# Patient Record
Sex: Female | Born: 1975 | Race: Black or African American | Hispanic: No | Marital: Married | Smoking: Never smoker
Health system: Southern US, Community
[De-identification: ages and names within clinical notes are randomized; demographics above are authoritative.]

---

## 2005-02-14 ENCOUNTER — Emergency Department: Payer: Self-pay | Admitting: Emergency Medicine

## 2017-10-08 ENCOUNTER — Other Ambulatory Visit: Payer: Self-pay | Admitting: Student

## 2017-10-08 DIAGNOSIS — Z1231 Encounter for screening mammogram for malignant neoplasm of breast: Secondary | ICD-10-CM

## 2017-10-26 ENCOUNTER — Ambulatory Visit
Admission: RE | Admit: 2017-10-26 | Discharge: 2017-10-26 | Disposition: A | Discharge status: Home / Self Care | Payer: BLUE CROSS/BLUE SHIELD | Source: Ambulatory Visit | Attending: Student | Admitting: Student

## 2017-10-26 ENCOUNTER — Encounter: Payer: Self-pay | Admitting: Radiology

## 2017-10-26 DIAGNOSIS — Z1231 Encounter for screening mammogram for malignant neoplasm of breast: Secondary | ICD-10-CM | POA: Insufficient documentation

## 2017-10-28 ENCOUNTER — Inpatient Hospital Stay
Admission: RE | Admit: 2017-10-28 | Discharge: 2017-10-28 | Disposition: A | Discharge status: Home / Self Care | Payer: Self-pay | Source: Ambulatory Visit | Attending: *Deleted | Admitting: *Deleted

## 2017-10-28 ENCOUNTER — Other Ambulatory Visit: Payer: Self-pay | Admitting: *Deleted

## 2017-10-28 DIAGNOSIS — Z9289 Personal history of other medical treatment: Secondary | ICD-10-CM

## 2018-09-28 ENCOUNTER — Other Ambulatory Visit: Payer: Self-pay | Admitting: Student

## 2018-09-28 DIAGNOSIS — Z1231 Encounter for screening mammogram for malignant neoplasm of breast: Secondary | ICD-10-CM

## 2018-11-10 ENCOUNTER — Other Ambulatory Visit: Payer: Self-pay

## 2018-11-10 ENCOUNTER — Ambulatory Visit
Admission: RE | Admit: 2018-11-10 | Discharge: 2018-11-10 | Disposition: A | Discharge status: Home / Self Care | Payer: BC Managed Care – PPO | Source: Ambulatory Visit | Attending: Student | Admitting: Student

## 2018-11-10 DIAGNOSIS — Z1231 Encounter for screening mammogram for malignant neoplasm of breast: Secondary | ICD-10-CM

## 2019-07-03 ENCOUNTER — Ambulatory Visit: Payer: BC Managed Care – PPO | Attending: Internal Medicine

## 2019-07-03 DIAGNOSIS — Z23 Encounter for immunization: Secondary | ICD-10-CM | POA: Insufficient documentation

## 2019-07-03 NOTE — Progress Notes (Signed)
   Covid-19 Vaccination Clinic  Name:  WAUNETA HANFORD    MRN: [id number] DOB: [birth year]  07/03/2019  Ms. Grable was observed post Covid-19 immunization for 15 minutes without incident. She was provided with Vaccine Information Sheet and instruction to access the V-Safe system.   Ms. Currey was instructed to call 911 with any severe reactions post vaccine: Marland Kitchen Difficulty breathing  . Swelling of face and throat  . A fast heartbeat  . A bad rash all over body  . Dizziness and weakness   Immunizations Administered    Name Date Dose VIS Date Route   Pfizer COVID-19 Vaccine 07/03/2019  8:55 AM 0.3 mL 04/08/2019 Intramuscular   Manufacturer: [place]   Lot: [id number]   [place]: [id number]

## 2019-07-25 ENCOUNTER — Ambulatory Visit: Payer: BC Managed Care – PPO | Attending: Internal Medicine

## 2019-07-25 DIAGNOSIS — Z23 Encounter for immunization: Secondary | ICD-10-CM

## 2019-07-25 NOTE — Progress Notes (Signed)
   Covid-19 Vaccination Clinic  Name:  MAILYN HORITA    MRN: [id number] DOB: 10-24-1975  07/25/2019  Ms. Jesko was observed post Covid-19 immunization for 15 minutes without incident. She was provided with Vaccine Information Sheet and instruction to access the V-Safe system.   Ms. Sadowsky was instructed to call 911 with any severe reactions post vaccine: Marland Kitchen Difficulty breathing  . Swelling of face and throat  . A fast heartbeat  . A bad rash all over body  . Dizziness and weakness   Immunizations Administered    Name Date Dose VIS Date Route   Pfizer COVID-19 Vaccine 07/25/2019  8:19 AM 0.3 mL 04/08/2019 Intramuscular   Manufacturer: [place]   Lot: [id number]   [place]: [id number]

## 2019-10-12 DIAGNOSIS — M25551 Pain in right hip: Secondary | ICD-10-CM | POA: Insufficient documentation

## 2019-11-02 ENCOUNTER — Other Ambulatory Visit: Payer: Self-pay | Admitting: Student

## 2019-11-02 DIAGNOSIS — Z1231 Encounter for screening mammogram for malignant neoplasm of breast: Secondary | ICD-10-CM

## 2019-11-15 ENCOUNTER — Ambulatory Visit
Admission: RE | Admit: 2019-11-15 | Discharge: 2019-11-15 | Disposition: A | Discharge status: Home / Self Care | Payer: BC Managed Care – PPO | Source: Ambulatory Visit | Attending: Student | Admitting: Student

## 2019-11-15 DIAGNOSIS — Z1231 Encounter for screening mammogram for malignant neoplasm of breast: Secondary | ICD-10-CM | POA: Insufficient documentation

## 2019-11-22 ENCOUNTER — Other Ambulatory Visit: Payer: Self-pay | Admitting: Student

## 2019-11-22 DIAGNOSIS — N6310 Unspecified lump in the right breast, unspecified quadrant: Secondary | ICD-10-CM

## 2019-11-22 DIAGNOSIS — R928 Other abnormal and inconclusive findings on diagnostic imaging of breast: Secondary | ICD-10-CM

## 2019-11-22 DIAGNOSIS — N6489 Other specified disorders of breast: Secondary | ICD-10-CM

## 2019-11-30 ENCOUNTER — Ambulatory Visit
Admission: RE | Admit: 2019-11-30 | Discharge: 2019-11-30 | Disposition: A | Discharge status: Home / Self Care | Payer: BC Managed Care – PPO | Source: Ambulatory Visit | Attending: Student | Admitting: Student

## 2019-11-30 ENCOUNTER — Other Ambulatory Visit: Payer: Self-pay

## 2019-11-30 DIAGNOSIS — R928 Other abnormal and inconclusive findings on diagnostic imaging of breast: Secondary | ICD-10-CM

## 2019-11-30 DIAGNOSIS — N6489 Other specified disorders of breast: Secondary | ICD-10-CM | POA: Insufficient documentation

## 2019-11-30 DIAGNOSIS — N6310 Unspecified lump in the right breast, unspecified quadrant: Secondary | ICD-10-CM

## 2019-12-05 ENCOUNTER — Other Ambulatory Visit: Payer: Self-pay | Admitting: Student

## 2019-12-05 DIAGNOSIS — R928 Other abnormal and inconclusive findings on diagnostic imaging of breast: Secondary | ICD-10-CM

## 2019-12-05 DIAGNOSIS — N6310 Unspecified lump in the right breast, unspecified quadrant: Secondary | ICD-10-CM

## 2019-12-07 ENCOUNTER — Ambulatory Visit
Admission: RE | Admit: 2019-12-07 | Discharge: 2019-12-07 | Disposition: A | Discharge status: Home / Self Care | Payer: BC Managed Care – PPO | Source: Ambulatory Visit | Attending: Student | Admitting: Student

## 2019-12-07 ENCOUNTER — Other Ambulatory Visit: Payer: Self-pay

## 2019-12-07 DIAGNOSIS — N6310 Unspecified lump in the right breast, unspecified quadrant: Secondary | ICD-10-CM | POA: Insufficient documentation

## 2019-12-07 DIAGNOSIS — R928 Other abnormal and inconclusive findings on diagnostic imaging of breast: Secondary | ICD-10-CM | POA: Diagnosis present

## 2019-12-07 HISTORY — PX: BREAST BIOPSY: SHX20

## 2019-12-08 LAB — SURGICAL PATHOLOGY

## 2020-02-03 ENCOUNTER — Encounter: Payer: Self-pay | Admitting: Podiatry

## 2020-02-03 ENCOUNTER — Other Ambulatory Visit: Payer: Self-pay

## 2020-02-03 ENCOUNTER — Ambulatory Visit (INDEPENDENT_AMBULATORY_CARE_PROVIDER_SITE_OTHER): Payer: BC Managed Care – PPO

## 2020-02-03 ENCOUNTER — Ambulatory Visit: Payer: BC Managed Care – PPO | Admitting: Podiatry

## 2020-02-03 DIAGNOSIS — M79674 Pain in right toe(s): Secondary | ICD-10-CM | POA: Diagnosis not present

## 2020-02-03 DIAGNOSIS — B351 Tinea unguium: Secondary | ICD-10-CM | POA: Diagnosis not present

## 2020-02-03 DIAGNOSIS — M79675 Pain in left toe(s): Secondary | ICD-10-CM

## 2020-02-03 DIAGNOSIS — M2011 Hallux valgus (acquired), right foot: Secondary | ICD-10-CM

## 2020-02-03 MED ORDER — TERBINAFINE HCL 250 MG PO TABS: 250.0000 mg | ORAL_TABLET | Freq: Every day | ORAL | 0 refills | Status: AC

## 2020-02-03 NOTE — Progress Notes (Signed)
° °  Subjective: 44 y.o. female presents today as a new patient for evaluation of a symptomatic bunion to the right foot.  Patient denies history of injury or trauma to the area.  She has not done anything for treatment other than shoe gear modifications.  She states that her bunion gets red and swollen on occasion with certain shoes.  This has been going on for several weeks now.   Patient also states that she has discoloration with thickening to her bilateral toenails.  She would like to have them evaluated.  This is been present for several years.  She denies trauma to the nail plate.   No past medical history on file.    Objective: Physical Exam General: The patient is alert and oriented x3 in no acute distress.  Dermatology: Skin is cool, dry and supple bilateral lower extremities. Negative for open lesions or macerations.  Hyperkeratotic dystrophic discolored nails noted 1-5 bilateral.  Vascular: Palpable pedal pulses bilaterally. No edema or erythema noted. Capillary refill within normal limits.  Neurological: Epicritic and protective threshold grossly intact bilaterally.   Musculoskeletal Exam: Clinical evidence of bunion deformity noted to the respective foot. There is moderate pain on palpation range of motion of the first MPJ. Lateral deviation of the hallux noted consistent with hallux abductovalgus.  Radiographic Exam: Increased intermetatarsal angle greater than 15 with a hallux abductus angle greater than 30 noted on AP view. Moderate degenerative changes noted within the first MPJ.  Assessment: 1. HAV w/ bunion deformity right   Plan of Care:  1. Patient was evaluated. X-Rays reviewed. 2.  Discussed conservative versus surgical management of bunion deformities.  At the moment the recommend conservative treatment.  Recommend good supportive shoes that are wide fitting and do not irritate the foot. 3.  We also discussed different treatment modalities for toenail fungus.   Prescription for Lamisil 250 mg #90 daily.  Patient denies any liver symptoms or pathology. 4.  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 Edrick Kins, [place]                                        [place], [place] [address]                Office [phone]  Fax [phone]

## 2021-01-30 ENCOUNTER — Other Ambulatory Visit: Payer: Self-pay | Admitting: Student

## 2021-01-30 DIAGNOSIS — D241 Benign neoplasm of right breast: Secondary | ICD-10-CM

## 2021-01-30 DIAGNOSIS — R928 Other abnormal and inconclusive findings on diagnostic imaging of breast: Secondary | ICD-10-CM

## 2021-07-05 ENCOUNTER — Ambulatory Visit: Payer: BC Managed Care – PPO | Admitting: Podiatry

## 2021-07-05 ENCOUNTER — Ambulatory Visit (INDEPENDENT_AMBULATORY_CARE_PROVIDER_SITE_OTHER): Payer: BC Managed Care – PPO

## 2021-07-05 ENCOUNTER — Other Ambulatory Visit: Payer: Self-pay

## 2021-07-05 ENCOUNTER — Encounter: Payer: Self-pay | Admitting: Podiatry

## 2021-07-05 DIAGNOSIS — M2011 Hallux valgus (acquired), right foot: Secondary | ICD-10-CM

## 2021-07-05 NOTE — Progress Notes (Signed)
? ?  Subjective: 46 y.o. female presents today for follow-up evaluation of a symptomatic bunion to the right foot.  Patient states that she is ready to have surgery performed to correct for the bunion deformity.  She was last seen in the office on 02/03/2020 and she says over the last few years it is only gotten worse and more symptomatic.  She presents for further treatment and evaluation ? ?No past medical history on file. ? ?Past Surgical History:  ?Procedure Laterality Date  ? BREAST BIOPSY Right 12/07/2019  ? Q shape, path pending, [place] Bx   ? ?No Known Allergies ? ? ?Objective: ?Physical Exam ?General: The patient is alert and oriented x3 in no acute distress. ? ?Dermatology: Skin is cool, dry and supple bilateral lower extremities. Negative for open lesions or macerations.  Hyperkeratotic dystrophic discolored nails noted 1-5 bilateral. ? ?Vascular: Palpable pedal pulses bilaterally. No edema or erythema noted. Capillary refill within normal limits. ? ?Neurological: Epicritic and protective threshold grossly intact bilaterally.  ? ?Musculoskeletal Exam: Clinical evidence of bunion deformity noted to the respective foot. There is moderate pain on palpation range of motion of the first MPJ. Lateral deviation of the hallux noted consistent with hallux abductovalgus. ? ?Radiographic Exam: Increased intermetatarsal angle greater than 15? with a hallux abductus angle greater than 30? noted on AP view. Moderate degenerative changes noted within the first MPJ. ? ?Assessment: ?1. HAV w/ bunion deformity right ? ? ?Plan of Care:  ?1. Patient was evaluated. X-Rays reviewed. ?2. Today we discussed the conservative versus surgical management of the presenting pathology. The patient opts for surgical management. All possible complications and details of the procedure were explained. All patient questions were answered. No guarantees were expressed or implied. ?3. Authorization for surgery was initiated today. Surgery will  consist of bunionectomy with osteotomy right ?4.  Return to clinic 1 week postop ? ? 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 ? ?Edrick Kins, DPM ?[place] ? ?Dr. Edrick Kins, DPM  ?  ?[place]                                        ?[place], [place] [address]                ?Office [phone]  ?Fax [phone] ? ? ? ? ? ? ?

## 2021-07-09 ENCOUNTER — Telehealth: Payer: Self-pay

## 2021-07-09 NOTE — Telephone Encounter (Signed)
Received surgery paperwork from the [place] office. Left a message for Abigail to call and schedule surgery with Dr. Amalia Hailey. ?

## 2021-07-10 ENCOUNTER — Telehealth: Payer: Self-pay | Admitting: Urology

## 2021-07-10 NOTE — Telephone Encounter (Signed)
DOS - 08/01/21 ? ?[place] BUNIONECTOM[place] RIGHT --- [phone] ? ?BCBS EFFECTIVE DATE - 04/28/20 ? ?PLAN DEDUCTIBLE - $500.00 ?OUT OF POCKET - $3,000.00 ?COINSURANCE - 0% ?COPAY - $0.00 ? ? ?SPOKE WITH DANN M. WITH [employer] AND SHE STATED THAT FOR CPT CODE 48889 NO PRIOR AUTH IS REQUIRED. ? ?REF # [id number] ?

## 2021-07-12 DIAGNOSIS — M79676 Pain in unspecified toe(s): Secondary | ICD-10-CM

## 2021-08-01 ENCOUNTER — Other Ambulatory Visit: Payer: Self-pay | Admitting: Podiatry

## 2021-08-01 ENCOUNTER — Encounter: Payer: Self-pay | Admitting: Podiatry

## 2021-08-01 DIAGNOSIS — M2011 Hallux valgus (acquired), right foot: Secondary | ICD-10-CM | POA: Diagnosis not present

## 2021-08-01 MED ORDER — MELOXICAM 15 MG PO TABS: 15.0000 mg | ORAL_TABLET | Freq: Every day | ORAL | 1 refills | Status: AC

## 2021-08-01 MED ORDER — OXYCODONE-ACETAMINOPHEN 5-325 MG PO TABS: 1.0000 | ORAL_TABLET | ORAL | 0 refills | Status: AC | PRN

## 2021-08-01 NOTE — Progress Notes (Signed)
PRN postop 

## 2021-08-08 ENCOUNTER — Ambulatory Visit (INDEPENDENT_AMBULATORY_CARE_PROVIDER_SITE_OTHER): Payer: BC Managed Care – PPO

## 2021-08-08 ENCOUNTER — Ambulatory Visit (INDEPENDENT_AMBULATORY_CARE_PROVIDER_SITE_OTHER): Payer: BC Managed Care – PPO | Admitting: Podiatry

## 2021-08-08 ENCOUNTER — Encounter: Payer: Self-pay | Admitting: Podiatry

## 2021-08-08 VITALS — BP 125/97 | HR 80 | Temp 98.0°F

## 2021-08-08 DIAGNOSIS — Z9889 Other specified postprocedural states: Secondary | ICD-10-CM | POA: Diagnosis not present

## 2021-08-08 DIAGNOSIS — M2011 Hallux valgus (acquired), right foot: Secondary | ICD-10-CM | POA: Diagnosis not present

## 2021-08-08 NOTE — Progress Notes (Signed)
?  Subjective:  ?Patient ID: Kathy Mitchell, female    DOB: 04/28/1976,  MRN: [id number] ? ?Chief Complaint  ?Patient presents with  ? Routine Post Op  ?  POV #1 DOS 08/01/2021 BUNIONECTOMY W/OSTEOTOMY RT FOOT/DR EVANS PT  ? ? ?DOS: 08/01/2021 ?Procedure: Right foot bunionectomy ? ?46 y.o. female returns for post-op check.  Patient states she is doing well.  The surgery was done by Dr. Amalia Hailey.  She states occasional pain.  Pain is controlled by pain medication.  Denies any other acute issues ? ?Review of Systems: Negative except as noted in the HPI. Denies N/V/F/Ch. ? ?No past medical history on file. ? ?Current Outpatient Medications:  ?  cetirizine (ZYRTEC) 10 MG tablet, Take by mouth., Disp: , Rfl:  ?  fluticasone (FLONASE) 50 MCG/ACT nasal spray, Place into the nose., Disp: , Rfl:  ?  meloxicam (MOBIC) 15 MG tablet, Take 1 tablet (15 mg total) by mouth daily., Disp: 30 tablet, Rfl: 1 ?  oxyCODONE-acetaminophen (PERCOCET) 5-325 MG tablet, Take 1 tablet by mouth every 4 (four) hours as needed for severe pain., Disp: 30 tablet, Rfl: 0 ?  terbinafine (LAMISIL) 250 MG tablet, Take 1 tablet (250 mg total) by mouth daily., Disp: 90 tablet, Rfl: 0 ? ?Social History  ? ?Tobacco Use  ?Smoking Status Never  ?Smokeless Tobacco Never  ? ? ?No Known Allergies ?Objective:  ? ?Vitals:  ? 08/08/21 1327  ?BP: (!) 125/97  ?Pulse: 80  ?Temp: 98 ?F (36.7 ?C)  ? ?There is no height or weight on file to calculate BMI. ?Constitutional Well developed. ?Well nourished.  ?Vascular Foot warm and well perfused. ?Capillary refill normal to all digits.   ?Neurologic Normal speech. ?Oriented to person, place, and time. ?Epicritic sensation to light touch grossly present bilaterally.  ?Dermatologic Skin healing well without signs of infection. Skin edges well coapted without signs of infection.  ?Orthopedic: Tenderness to palpation noted about the surgical site.  ? ?Radiographs: 3 views of skeletally mature the right foot: Good correction alignment  noted reduction of sesamoid noted reduction of bunion noted.  No hardware failure noted ?Assessment:  ? ?1. Hallux abducto valgus, right   ?2. S/P foot surgery, right   ? ?Plan:  ?Patient was evaluated and treated and all questions answered. ? ?S/p foot surgery right ?-Progressing as expected post-operatively. ?-XR: See above ?-WB Status: Partial weightbearing to the heel with a cam boot ?-Sutures: Intact.  No clinical signs of Deis is noted.  No complication noted. ?-Medications: None ?-Foot redressed. ? ?No follow-ups on file.  ?

## 2021-08-16 ENCOUNTER — Encounter: Payer: Self-pay | Admitting: Podiatry

## 2021-08-16 ENCOUNTER — Ambulatory Visit (INDEPENDENT_AMBULATORY_CARE_PROVIDER_SITE_OTHER): Payer: BC Managed Care – PPO | Admitting: Podiatry

## 2021-08-16 DIAGNOSIS — Z9889 Other specified postprocedural states: Secondary | ICD-10-CM

## 2021-08-16 NOTE — Progress Notes (Signed)
? ?  Subjective:  ?Patient presents today status post bunionectomy with first metatarsal osteotomy right foot. DOS: 08/01/2021.  Patient states that she is doing well.  Minimal pain.  She has been minimally weightbearing in the cam boot.  No new complaints at this time ? ?No past medical history on file. ?  ? ?Objective/Physical Exam ?Neurovascular status intact.  Skin incisions appear to be well coapted with sutures intact. No sign of infectious process noted. No dehiscence. No active bleeding noted. Moderate edema noted to the surgical extremity. ? ?Assessment: ?1. s/p bunionectomy with first metatarsal osteotomy RT. DOS: 08/01/2021 ? ? ?Plan of Care:  ?1. Patient was evaluated. X-rays reviewed that were taken last visit ?2.  Sutures removed ?3.  Compression ankle sleeve dispensed.  Wear daily ?4.  Continue minimal weightbearing in the cam boot ?5.  Return to clinic in 2 weeks for follow-up x-ray ? 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 ?Edrick Kins, DPM ?[place] ? ?Dr. Edrick Kins, DPM  ?  ?[address]                                    ?[place], [place] [address]                ?Office [phone]  ?Fax [phone] ? ? ? ? ? ?

## 2021-08-30 ENCOUNTER — Ambulatory Visit (INDEPENDENT_AMBULATORY_CARE_PROVIDER_SITE_OTHER): Payer: BC Managed Care – PPO

## 2021-08-30 ENCOUNTER — Encounter: Payer: Self-pay | Admitting: Podiatry

## 2021-08-30 ENCOUNTER — Ambulatory Visit (INDEPENDENT_AMBULATORY_CARE_PROVIDER_SITE_OTHER): Payer: BC Managed Care – PPO | Admitting: Podiatry

## 2021-08-30 ENCOUNTER — Telehealth: Payer: Self-pay | Admitting: Podiatry

## 2021-08-30 DIAGNOSIS — Z9889 Other specified postprocedural states: Secondary | ICD-10-CM

## 2021-08-30 NOTE — Telephone Encounter (Signed)
Pt is requesting a Dr note for needing to be out of work. Pt works at [employer] as the [employer]. ?Fax# [phone] ?

## 2021-08-30 NOTE — Progress Notes (Signed)
? ?  Subjective:  ?Patient presents today status post bunionectomy with first metatarsal osteotomy right foot. DOS: 08/01/2021.  Patient doing well.  She has been weightbearing in the cam boot as instructed.  No new complaints at this time ? ?No past medical history on file. ?  ? ? ?Objective/Physical Exam ?Neurovascular status intact.  Skin incisions healed.  There continues to be some moderate edema noted.  Good alignment of the first ray. ? ?Radiographic exam RT foot 08/30/2021 ?Good alignment of the first ray.  Osteotomy site and orthopedic hardware appears stable with routine healing.  There is some slight medial deviation of the hallux at the MTP ? ?Assessment: ?1. s/p bunionectomy with first metatarsal osteotomy RT. DOS: 08/01/2021 ? ? ?Plan of Care:  ?1. Patient was evaluated.  X-rays reviewed  ?2.  Patient may now slowly begin to transition out of the cam boot into good supportive shoes and sneakers ?3.  Continue to refrain from work for an additional month ?4.  Continue compression ankle sleeve ?5.  Recommend daily stretching and range of motion exercises.   ?6.  Return to clinic 4 weeks for follow-up x-ray ? 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 ?Edrick Kins, DPM ?[place] ? ?Dr. Edrick Kins, DPM  ?  ?[address]                                    ?[place],  [address]                ?Office [phone]  ?Fax [phone] ? ? ? ? ? ?

## 2021-08-30 NOTE — Telephone Encounter (Signed)
Okay to provide note to be out of work.  Patient can come into the office and pick up the work note.  Refrain from work additional 4 weeks until next follow-up appointment.  Thanks, Dr. Amalia Hailey

## 2021-09-02 ENCOUNTER — Encounter: Payer: Self-pay | Admitting: Podiatry

## 2021-09-02 NOTE — Telephone Encounter (Signed)
I am returning your call.  Dr. Amalia Hailey said it's okay for you to get a note to be out of work for an additional 4 weeks until your next follow-up appointment.  "I already got the note.  I got it this morning.  Thanks for calling me back." ?

## 2021-10-04 ENCOUNTER — Encounter: Payer: Self-pay | Admitting: Podiatry

## 2021-10-04 ENCOUNTER — Ambulatory Visit (INDEPENDENT_AMBULATORY_CARE_PROVIDER_SITE_OTHER): Payer: BC Managed Care – PPO

## 2021-10-04 ENCOUNTER — Ambulatory Visit: Payer: BC Managed Care – PPO | Admitting: Podiatry

## 2021-10-04 DIAGNOSIS — Z9889 Other specified postprocedural states: Secondary | ICD-10-CM

## 2021-10-04 NOTE — Progress Notes (Signed)
   Subjective:  Patient presents today status post bunionectomy with first metatarsal osteotomy right foot. DOS: 08/01/2021.  Patient doing well.  Patient has been wearing tennis shoes for the most part.  Occasionally she will wear the cam boot if she is going to be on her feet for prolonged period of time.  Overall she believes is doing very well.  No pain.  She presents for further treatment and evaluation  No past medical history on file.    Objective/Physical Exam Neurovascular status intact.  Skin incisions healed.  Edema resolved.  Good alignment of the first ray.  There is some limited range of motion with plantarflexion of the toe.  Radiographic exam RT foot 10/04/2021 Good alignment of the first ray.  Osteotomy site and orthopedic hardware appears stable with routine healing.  There is some slight medial deviation of the hallux at the MTP.  Unchanged since prior x-rays  Assessment: 1. s/p bunionectomy with first metatarsal osteotomy RT. DOS: 08/01/2021   Plan of Care:  1. Patient was evaluated.  X-rays reviewed  2.  Patient may completely discontinue the cam boot.  Overall she is doing very well. 3.  Resume good supportive tennis shoes and sneakers daily 4.  Patient may also begin to increase her exercise and activity back to full activity no restrictions 5.  Recommend daily range of motion exercises to the great toe joint  6.  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 Edrick Kins, DPM    [address], [place] [address]                Office [phone]  Fax [phone]

## 2021-10-17 ENCOUNTER — Other Ambulatory Visit: Payer: Self-pay | Admitting: Student

## 2021-10-17 DIAGNOSIS — R928 Other abnormal and inconclusive findings on diagnostic imaging of breast: Secondary | ICD-10-CM

## 2021-10-17 DIAGNOSIS — D241 Benign neoplasm of right breast: Secondary | ICD-10-CM

## 2021-11-11 ENCOUNTER — Ambulatory Visit
Admission: RE | Admit: 2021-11-11 | Discharge: 2021-11-11 | Disposition: A | Discharge status: Home / Self Care | Payer: BC Managed Care – PPO | Source: Ambulatory Visit | Attending: Student | Admitting: Student

## 2021-11-11 DIAGNOSIS — R928 Other abnormal and inconclusive findings on diagnostic imaging of breast: Secondary | ICD-10-CM | POA: Diagnosis present

## 2021-11-11 DIAGNOSIS — D241 Benign neoplasm of right breast: Secondary | ICD-10-CM | POA: Diagnosis present

## 2022-06-24 IMAGING — MG US  BREAST BX W/ LOC DEV 1ST LESION IMG BX SPEC US GUIDE*R*
1 series · 8 of 8 positions shown · non-contrast
Comparison: Previous exam(s).
COMPARISON: Previous exam(s).

Addendum:
CLINICAL DATA: 44-year-old female presenting for ultrasound-guided
biopsy of a right breast mass.

EXAM:
ULTRASOUND GUIDED RIGHT BREAST CORE NEEDLE BIOPSY

[Series 1: MG view · 0.07mm/px · 8 of 10 slices shown]
[im 1/10]
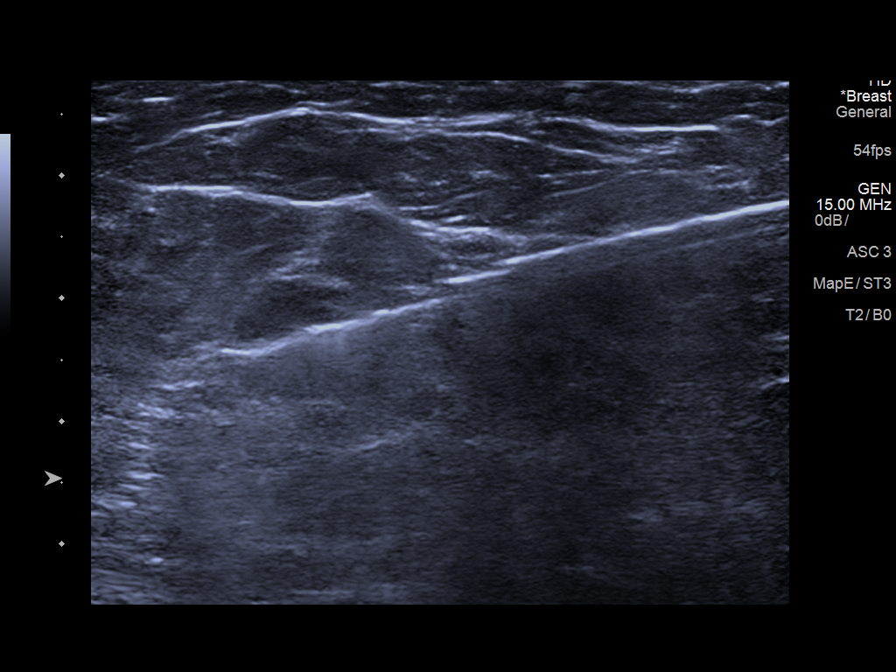
[im 2/10]
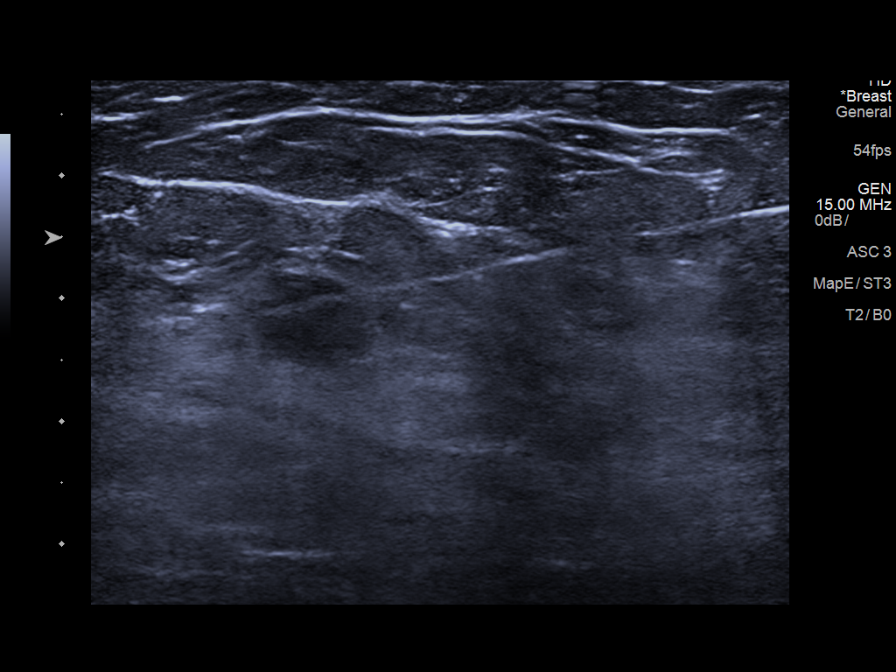
[im 3/10]
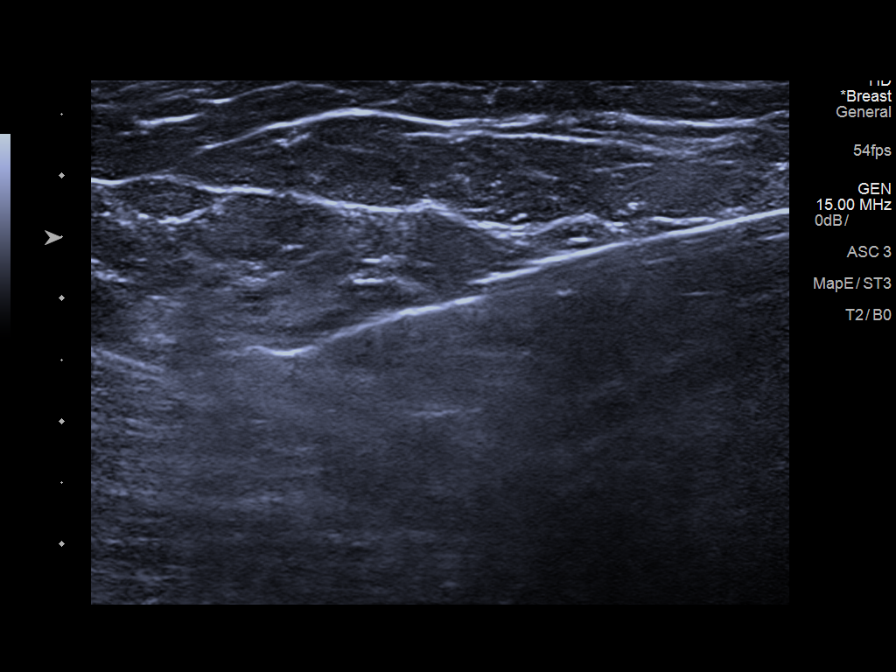
[im 4/10]
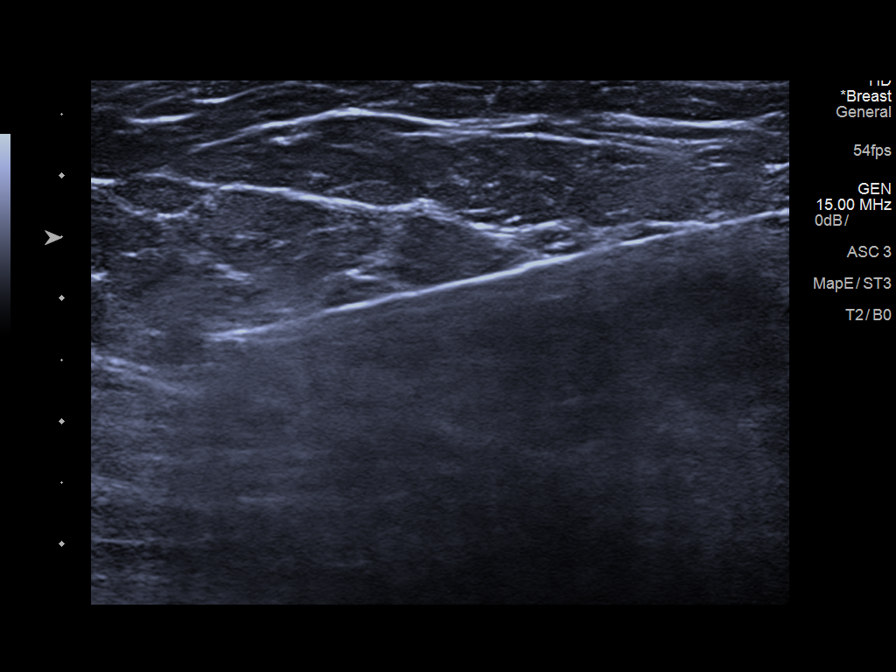
[im 6/10]
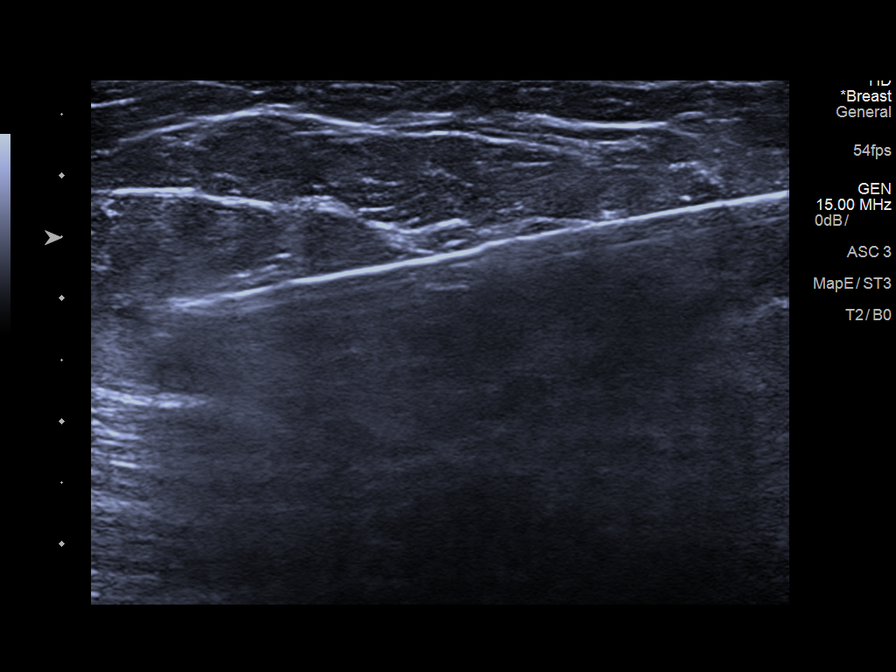
[im 7/10]
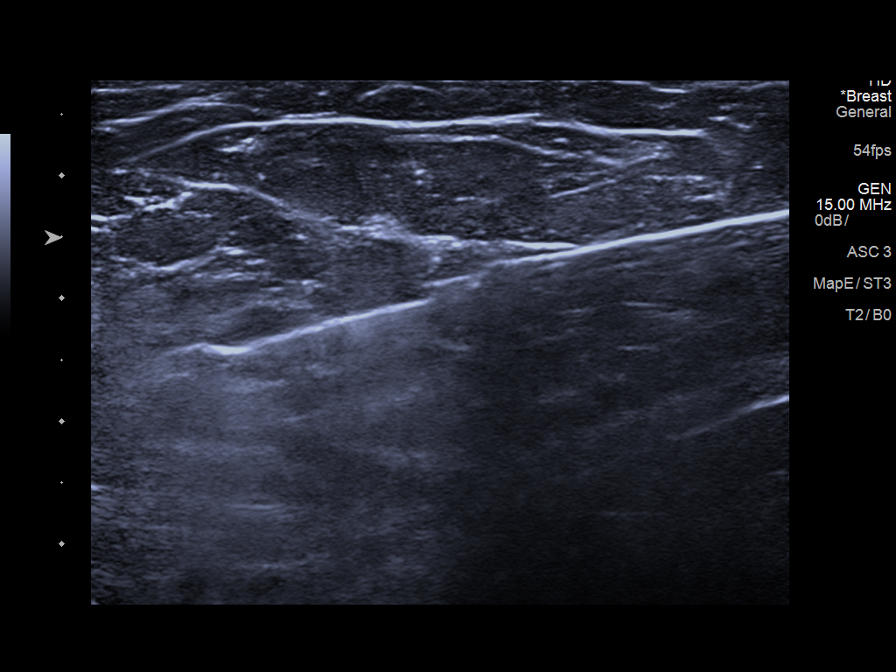
[im 8/10]
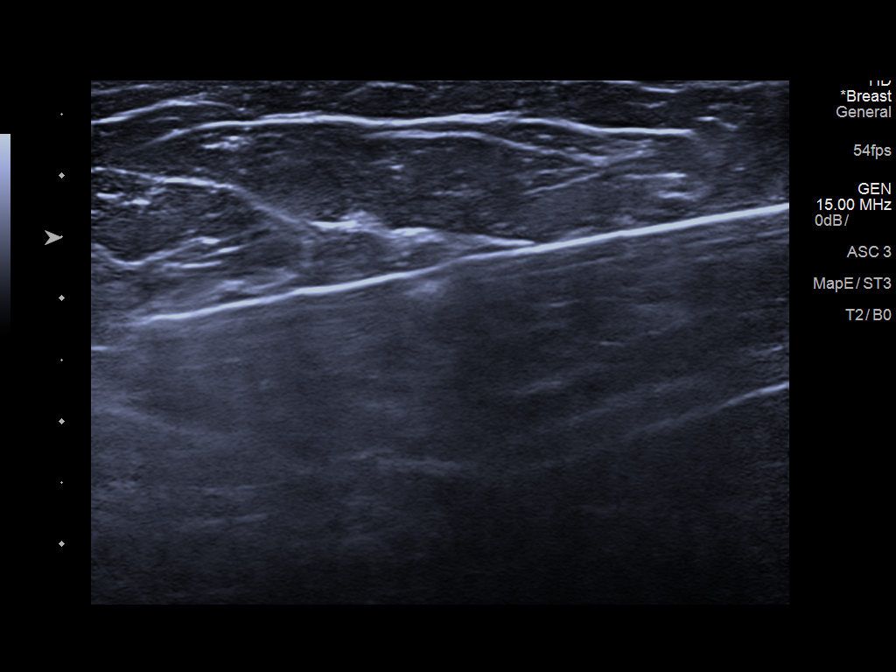
[im 10/10]
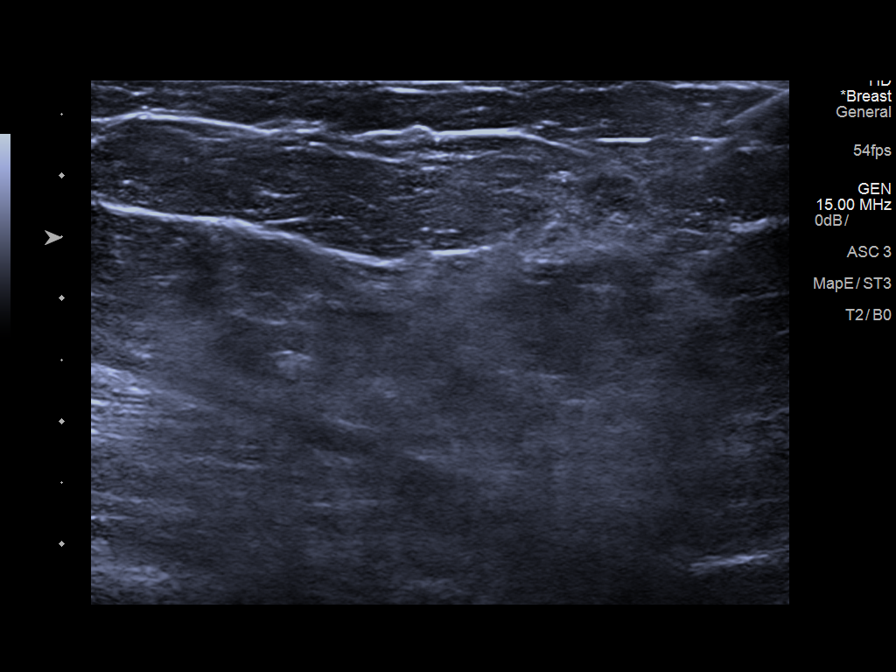

[8 of 8 positions shown; findings below may reference images not displayed]



Lesion quadrant: Upper inner quadrant

Using sterile technique and 1% Lidocaine as local anesthetic, under
direct ultrasound visualization, a 14 gauge Jim device was
used to perform biopsy of a mass in the right breast at 1 o'clock
using an inferior approach. At the conclusion of the procedure a Q
shaped tissue marker clip was deployed into the biopsy cavity.
Follow up 2 view mammogram was performed and dictated separately.
IMPRESSION: Ultrasound guided biopsy of a right breast mass at 1 o'clock. No
apparent complications.

ADDENDUM:
PATHOLOGY revealed: DIAGNOSIS: A. BREAST, RIGHT AT [DATE], 8 CM FROM
THE NIPPLE; ULTRASOUND-GUIDED CORE NEEDLE BIOPSY: - FRAGMENTS OF
BENIGN FIBROADENOMA. - NEGATIVE FOR ATYPICAL PROLIFERATIVE BREAST
DISEASE.

Pathology results are CONCORDANT with imaging findings, per Dr.
Gauqmebullia Okush.

Pathology results and recommendations below were discussed with
patient by telephone on 12/08/2019. Patient reported biopsy site
within normal limits with slight tenderness at the site. Post biopsy
care instructions were reviewed, questions were answered and my
direct phone number was provided to patient. Patient was instructed
to call [HOSPITAL] if any concerns or questions arise
related to the biopsy.

Recommendation: Patient instructed to return in six months for
diagnostic RIGHT breast ultrasound to evaluate growth rate of
fibroadenoma.

Pathology results reported by Rapperholic Demordzi RN on 12/09/2019.



Lesion quadrant: Upper inner quadrant

Using sterile technique and 1% Lidocaine as local anesthetic, under
direct ultrasound visualization, a 14 gauge Jim device was
used to perform biopsy of a mass in the right breast at 1 o'clock
using an inferior approach. At the conclusion of the procedure a Q
shaped tissue marker clip was deployed into the biopsy cavity.
Follow up 2 view mammogram was performed and dictated separately.
IMPRESSION: Ultrasound guided biopsy of a right breast mass at 1 o'clock. No
apparent complications.

## 2022-06-24 IMAGING — MG MM BREAST LOCALIZATION CLIP
4 series · 4 of 12 positions shown · non-contrast
Comparison: Previous exam(s).

CLINICAL DATA: Post biopsy mammogram of the right breast for clip
placement.

EXAM:
DIAGNOSTIC RIGHT MAMMOGRAM POST ULTRASOUND BIOPSY

[R ML synth-2D]
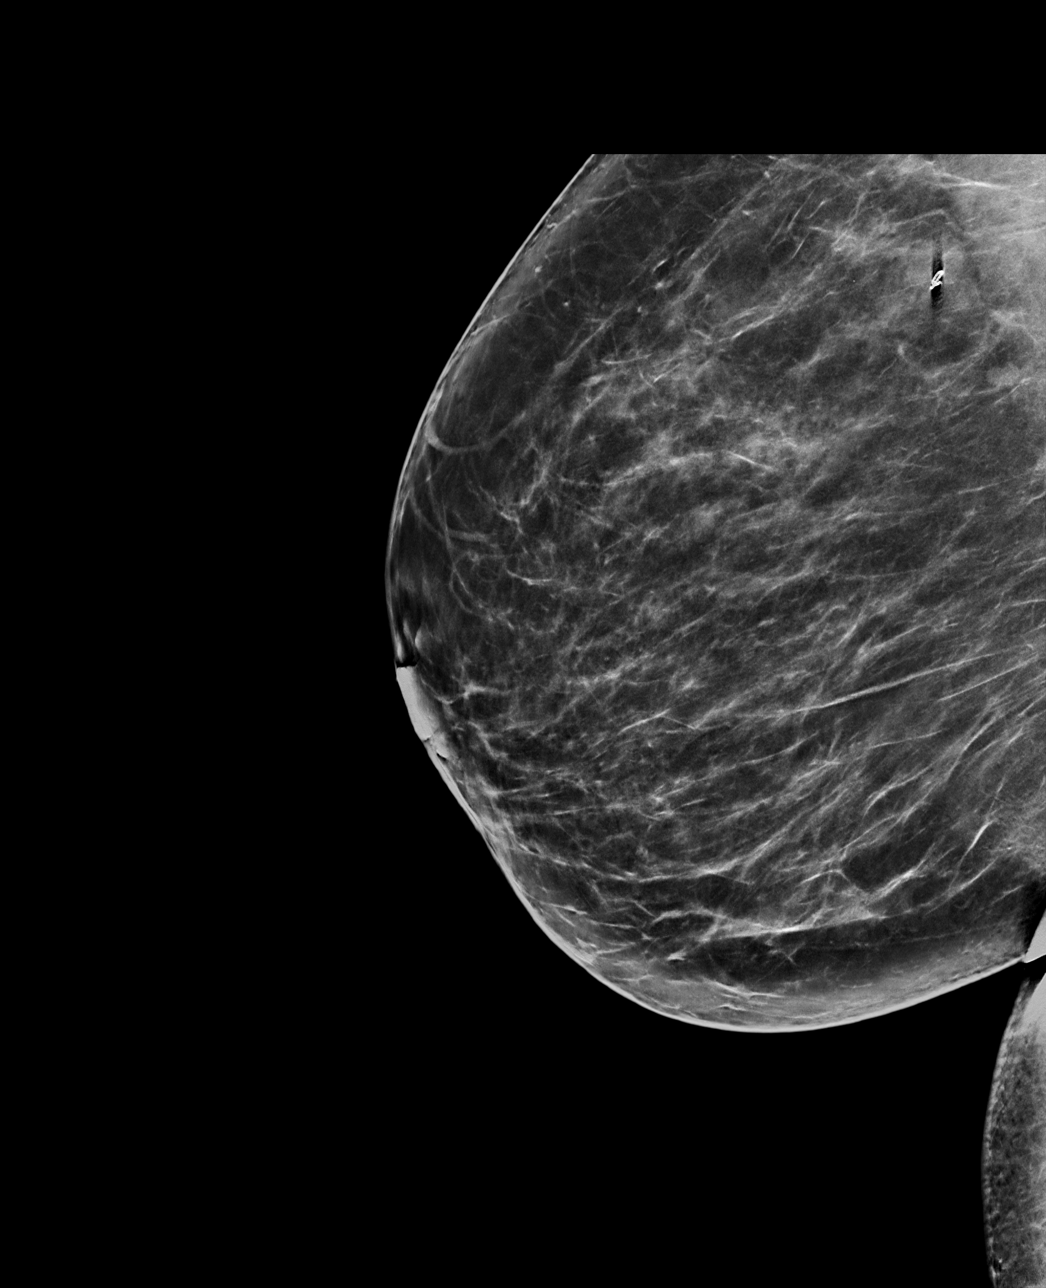

[R CC synth-2D]
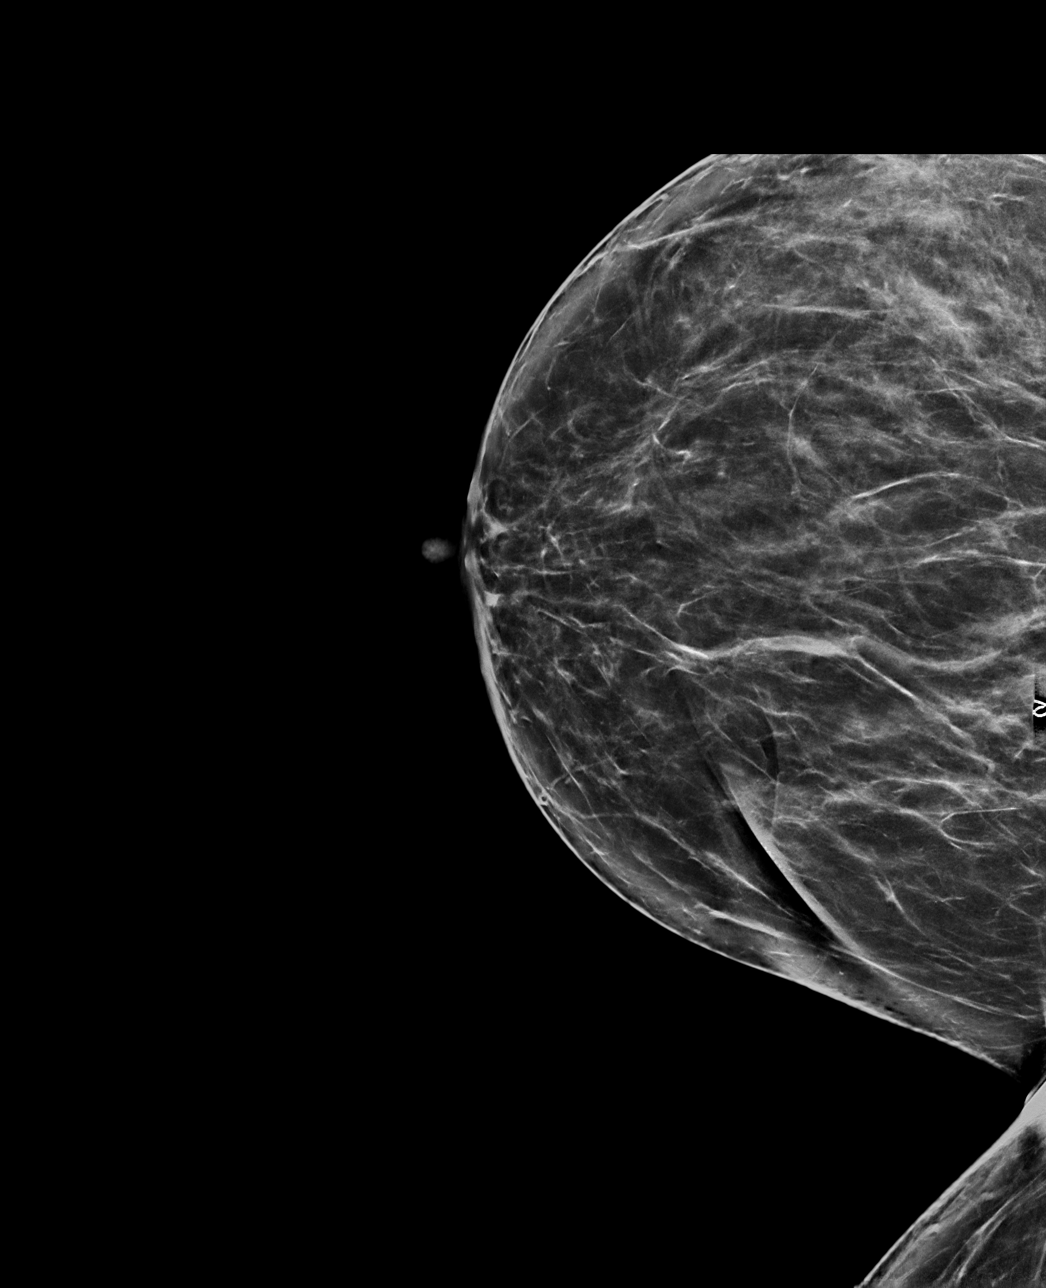

[R CC tomo · tomo slice 41/82.0]
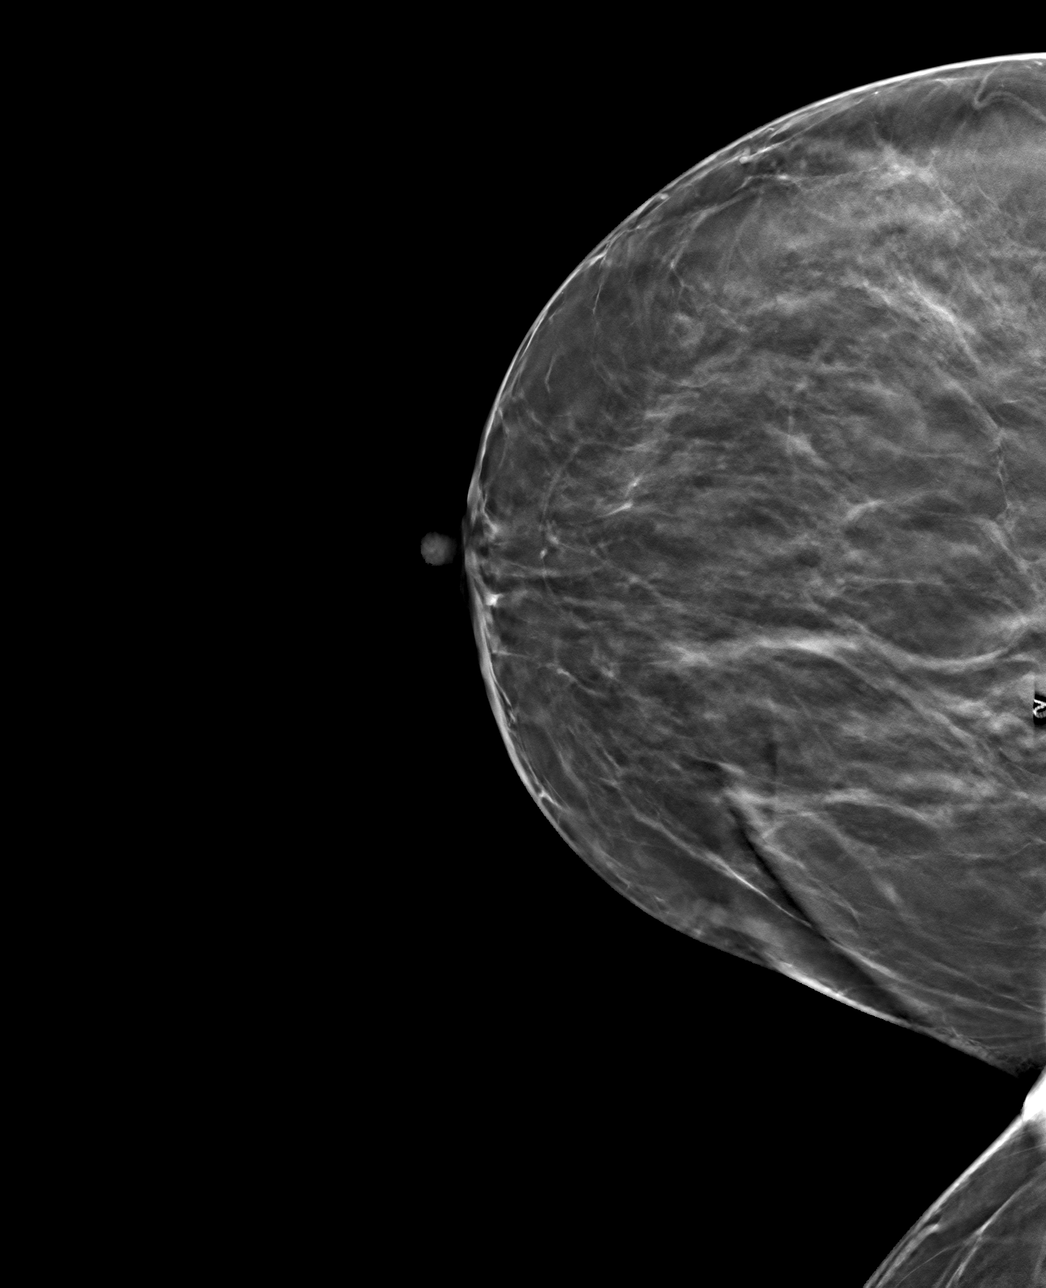

[R ML tomo · tomo slice 43/85.0]
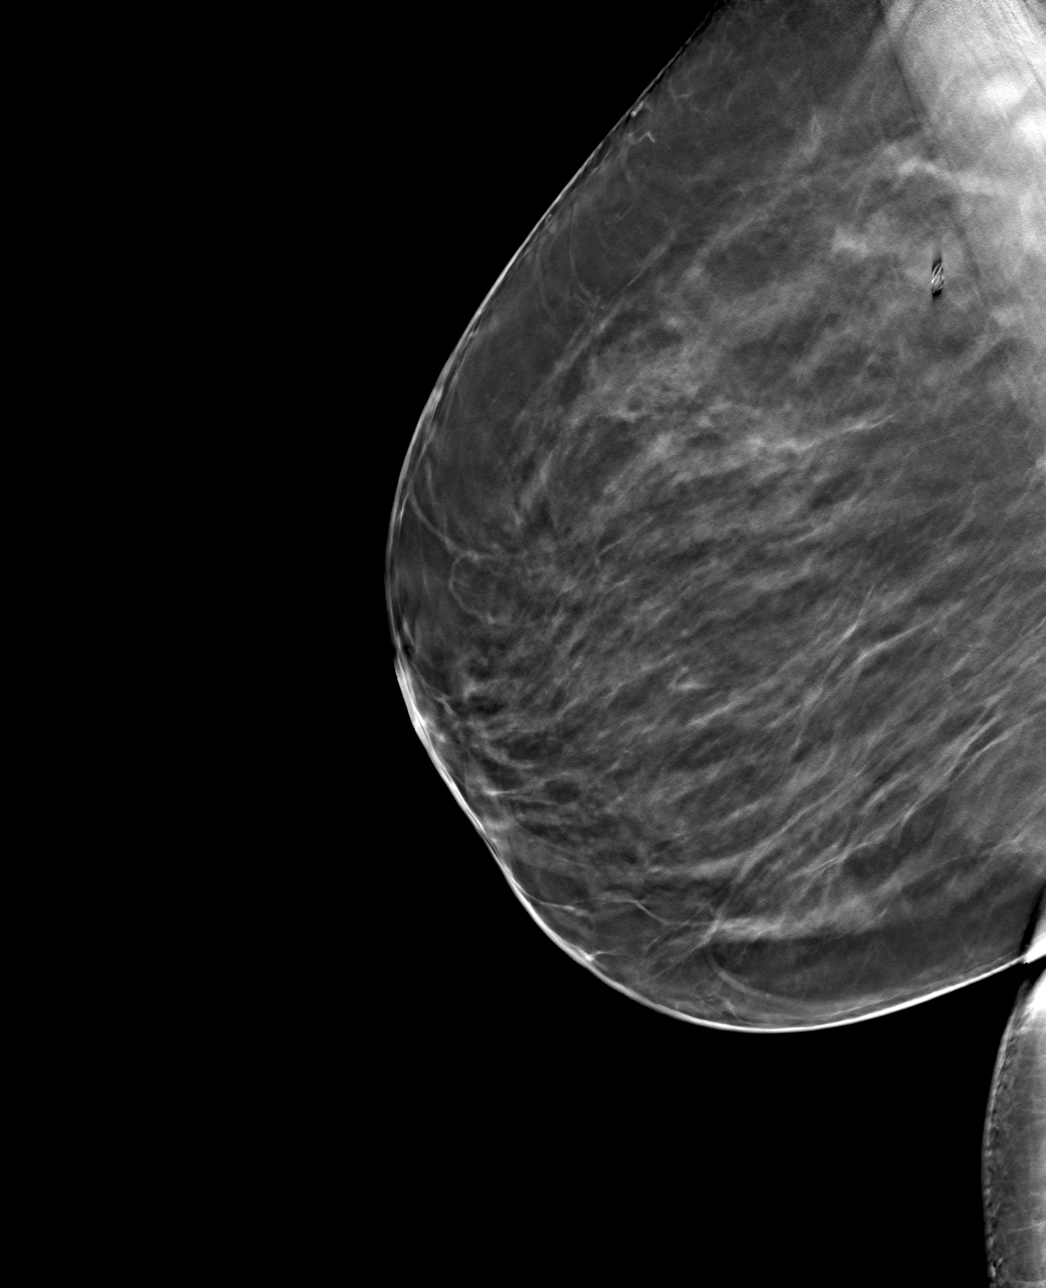

[4 of 12 positions shown; findings below may reference images not displayed]

FINDINGS: Mammographic images were obtained following ultrasound guided biopsy
of a mass in the right breast at 1 o'clock. The biopsy marking clip
is in expected position at the site of biopsy.
IMPRESSION: Appropriate positioning of the Q shaped biopsy marking clip at the
site of biopsy in the upper inner right breast.

Final Assessment: Post Procedure Mammograms for Marker Placement

## 2022-11-03 ENCOUNTER — Other Ambulatory Visit: Payer: Self-pay | Admitting: Student

## 2022-11-03 DIAGNOSIS — Z1231 Encounter for screening mammogram for malignant neoplasm of breast: Secondary | ICD-10-CM

## 2022-11-12 ENCOUNTER — Ambulatory Visit
Admission: RE | Admit: 2022-11-12 | Discharge: 2022-11-12 | Disposition: A | Discharge status: Home / Self Care | Payer: BC Managed Care – PPO | Source: Ambulatory Visit | Attending: Student | Admitting: Student

## 2022-11-12 DIAGNOSIS — Z1231 Encounter for screening mammogram for malignant neoplasm of breast: Secondary | ICD-10-CM | POA: Insufficient documentation

## 2023-11-03 ENCOUNTER — Other Ambulatory Visit: Payer: Self-pay | Admitting: Student

## 2023-11-03 DIAGNOSIS — Z1231 Encounter for screening mammogram for malignant neoplasm of breast: Secondary | ICD-10-CM

## 2023-11-17 ENCOUNTER — Encounter: Payer: Self-pay | Admitting: Radiology

## 2023-11-17 ENCOUNTER — Ambulatory Visit
Admission: RE | Admit: 2023-11-17 | Discharge: 2023-11-17 | Disposition: A | Discharge status: Home / Self Care | Source: Ambulatory Visit | Attending: Student | Admitting: Student

## 2023-11-17 DIAGNOSIS — Z1231 Encounter for screening mammogram for malignant neoplasm of breast: Secondary | ICD-10-CM | POA: Diagnosis present

## 2024-06-08 ENCOUNTER — Ambulatory Visit: Admitting: Nurse Practitioner
